# Patient Record
Sex: Female | Born: 1935 | Race: White | Hispanic: No | Marital: Married | Smoking: Never smoker
Health system: Southern US, Community
[De-identification: ages and names within clinical notes are randomized; demographics above are authoritative.]

## PROBLEM LIST (undated history)

## (undated) DIAGNOSIS — C801 Malignant (primary) neoplasm, unspecified: Secondary | ICD-10-CM

## (undated) DIAGNOSIS — I10 Essential (primary) hypertension: Secondary | ICD-10-CM

## (undated) DIAGNOSIS — R52 Pain, unspecified: Secondary | ICD-10-CM

## (undated) DIAGNOSIS — F419 Anxiety disorder, unspecified: Secondary | ICD-10-CM

## (undated) DIAGNOSIS — M1990 Unspecified osteoarthritis, unspecified site: Secondary | ICD-10-CM

## (undated) DIAGNOSIS — M791 Myalgia, unspecified site: Secondary | ICD-10-CM

## (undated) DIAGNOSIS — N63 Unspecified lump in unspecified breast: Secondary | ICD-10-CM

## (undated) DIAGNOSIS — E785 Hyperlipidemia, unspecified: Secondary | ICD-10-CM

## (undated) HISTORY — DX: Anxiety disorder, unspecified: F41.9

## (undated) HISTORY — DX: Unspecified osteoarthritis, unspecified site: M19.90

## (undated) HISTORY — PX: TONSILLECTOMY: SUR1361

## (undated) HISTORY — PX: OOPHORECTOMY: SHX86

## (undated) HISTORY — PX: BREAST SURGERY: SHX581

## (undated) HISTORY — PX: ABDOMINAL HYSTERECTOMY: SHX81

## (undated) HISTORY — DX: Hyperlipidemia, unspecified: E78.5

## (undated) HISTORY — DX: Pain, unspecified: R52

## (undated) HISTORY — DX: Myalgia, unspecified site: M79.10

## (undated) HISTORY — DX: Essential (primary) hypertension: I10

## (undated) HISTORY — PX: CATARACT EXTRACTION: SUR2

## (undated) HISTORY — PX: BACK SURGERY: SHX140

## (undated) HISTORY — DX: Unspecified lump in unspecified breast: N63.0

## (undated) HISTORY — DX: Malignant (primary) neoplasm, unspecified: C80.1

---

## 2006-08-04 ENCOUNTER — Encounter: Admission: RE | Admit: 2006-08-04 | Discharge: 2006-08-04 | Payer: Self-pay

## 2010-07-02 ENCOUNTER — Inpatient Hospital Stay (HOSPITAL_COMMUNITY): Payer: Medicare Other

## 2010-07-02 ENCOUNTER — Observation Stay (HOSPITAL_COMMUNITY)
Admission: RE | Admit: 2010-07-02 | Discharge: 2010-07-03 | Disposition: A | Discharge status: Home / Self Care | Payer: Medicare Other | Source: Ambulatory Visit | Attending: Neurosurgery | Admitting: Neurosurgery

## 2010-07-02 DIAGNOSIS — M5126 Other intervertebral disc displacement, lumbar region: Principal | ICD-10-CM | POA: Insufficient documentation

## 2010-07-02 DIAGNOSIS — G8929 Other chronic pain: Secondary | ICD-10-CM | POA: Insufficient documentation

## 2010-07-02 LAB — ABO/RH: ABO/RH(D): A NEG

## 2010-07-02 LAB — SURGICAL PCR SCREEN: MRSA, PCR: NEGATIVE

## 2010-07-02 LAB — DIFFERENTIAL
Basophils Absolute: 0 10*3/uL (ref 0.0–0.1)
Basophils Relative: 1 % (ref 0–1)
Eosinophils Absolute: 0.2 10*3/uL (ref 0.0–0.7)
Eosinophils Relative: 3 % (ref 0–5)
Lymphocytes Relative: 34 % (ref 12–46)
Lymphs Abs: 2.2 10*3/uL (ref 0.7–4.0)
Monocytes Absolute: 0.5 10*3/uL (ref 0.1–1.0)
Monocytes Relative: 7 % (ref 3–12)
Neutro Abs: 3.6 10*3/uL (ref 1.7–7.7)
Neutrophils Relative %: 55 % (ref 43–77)

## 2010-07-02 LAB — CBC
HCT: 36.8 % (ref 36.0–46.0)
Hemoglobin: 12.5 g/dL (ref 12.0–15.0)
MCH: 34.5 pg — ABNORMAL HIGH (ref 26.0–34.0)
MCHC: 34 g/dL (ref 30.0–36.0)
MCV: 101.7 fL — ABNORMAL HIGH (ref 78.0–100.0)
Platelets: 209 10*3/uL (ref 150–400)
RBC: 3.62 MIL/uL — ABNORMAL LOW (ref 3.87–5.11)
RDW: 12.4 % (ref 11.5–15.5)
WBC: 6.5 10*3/uL (ref 4.0–10.5)

## 2010-07-02 LAB — BASIC METABOLIC PANEL
BUN: 10 mg/dL (ref 6–23)
CO2: 27 mEq/L (ref 19–32)
Calcium: 9.2 mg/dL (ref 8.4–10.5)
Chloride: 103 mEq/L (ref 96–112)
Creatinine, Ser: 0.8 mg/dL (ref 0.4–1.2)
GFR calc non Af Amer: >60 mL/min (ref >60)
Glucose, Bld: 67 mg/dL — ABNORMAL LOW (ref 70–99)
Potassium: 4.5 mEq/L (ref 3.5–5.1)
Sodium: 138 mEq/L (ref 135–145)

## 2010-07-02 LAB — TYPE AND SCREEN: ABO/RH(D): A NEG

## 2010-07-05 NOTE — Op Note (Signed)
NAME:  Sophia Stone, Sophia Stone                 ACCOUNT NO.:  [id number]  MEDICAL RECORD NO.:  [id number]           PATIENT TYPE:  I  LOCATION:  3528                         FACILITY:  [facility]  PHYSICIAN:  Filbert Craze A. Lauri Purdum, M.D.    DATE OF BIRTH:  [birth year]  DATE OF PROCEDURE:  07/02/2010 DATE OF DISCHARGE:                              OPERATIVE REPORT   PREOPERATIVE DIAGNOSIS:  Left L4-5 extraforaminal herniated nucleus pulposus with radiculopathy.  POSTOPERATIVE DIAGNOSIS:  Left L4-5 extraforaminal herniated nucleus pulposus with radiculopathy.  PROCEDURE:  Left L4-5 extraforaminal microdiskectomy.  SURGEON:  Kathaleen Maser. Jabar Krysiak, MD.  ASSISTANT:  Donalee Citrin, MD.  ANESTHESIA:  General endotracheal.  INDICATIONS:  [name] is a 77 year old female with history of chronic left lower extremity pain consistent with a left-sided L4 radiculopathy, failing all conservative management.  Her workup demonstrates evidence of a small extraforaminal disk herniation with compression of the exiting left-sided L4 nerve root.  The patient did obtain relief with a left-sided L4 selective nerve root block.  I have discussed the situation with the patient and she decided to proceed with a left-sided L4-5 extraforaminal microdiskectomy.  OPERATIVE NOTE:  The patient was brought to the operating room and placed on the operating table in supine position.  After adequate level of anesthesia was achieved, the patient was placed prone onto Wilson frame, appropriately padded the patient's lumbar regions and prepped and draped sterilely.  A 10 blade was used to make a curvilinear skin incision overlying the L4-5 interspace where this was carried down sharply in the midline.  Subperiosteal dissection was then performed what was thought to be the transverse process and lateral facet of the L4-5 level.  X-rays taken in fact approach had been made to the L5-S1 level.  Approach was then redirected one level cephalad and  the retractor was replaced.  Extraforaminal approach was then performed by removing the lateral aspect of the superior facet at L5 and then resecting the intertransverse ligament.  Perineural fat was identified. L4 nerve was then identified.  Microscope was then brought onto the field using microdissection of the L4 nerve root underlying disk herniation.  Perineural fat was coagulated and divided.  L4 nerve root was gently mobilized cephalad.  A small amount of disk herniation was encountered.  This was then resected.  The disk space was entered and an intradiscal discectomy was then performed.  At this point, a very thorough diskectomy was achieved.  There was no injury to thecal sac or nerve root.  The wound was then irrigated with antibiotic solution. Gelfoam was placed topically for hemostasis and found to be good. Microscope and retractor system were removed.  Hemostasis in the muscle achieved with electrocautery.  The wound was closed in layers with Vicryl suture.  Steri-Strips and sterile dressings were applied.  There were no complications.  The patient tolerated the procedure well and she returned to the recovery room postoperatively.          ______________________________ Kathaleen Maser Jonalyn Sedlak, M.D.     HAP/MEDQ  D:  07/02/2010  T:  07/03/2010  Job:  [id number]  Electronically  Signed by Julio Sicks M.D. on 07/04/2010 11:23:40 PM

## 2010-11-30 ENCOUNTER — Telehealth (INDEPENDENT_AMBULATORY_CARE_PROVIDER_SITE_OTHER): Payer: Self-pay | Admitting: Surgery

## 2010-12-03 ENCOUNTER — Other Ambulatory Visit (INDEPENDENT_AMBULATORY_CARE_PROVIDER_SITE_OTHER): Payer: Self-pay | Admitting: Surgery

## 2010-12-03 ENCOUNTER — Ambulatory Visit (INDEPENDENT_AMBULATORY_CARE_PROVIDER_SITE_OTHER): Payer: Medicare Other | Admitting: Surgery

## 2010-12-03 ENCOUNTER — Encounter (INDEPENDENT_AMBULATORY_CARE_PROVIDER_SITE_OTHER): Payer: Self-pay | Admitting: Surgery

## 2010-12-03 VITALS — BP 122/72 | HR 72 | Temp 97.2°F | Ht 64.0 in | Wt 105.6 lb

## 2010-12-03 DIAGNOSIS — N63 Unspecified lump in unspecified breast: Secondary | ICD-10-CM | POA: Insufficient documentation

## 2010-12-03 DIAGNOSIS — C50919 Malignant neoplasm of unspecified site of unspecified female breast: Secondary | ICD-10-CM

## 2010-12-03 DIAGNOSIS — C50911 Malignant neoplasm of unspecified site of right female breast: Secondary | ICD-10-CM

## 2010-12-03 NOTE — Progress Notes (Signed)
Sophia Stone is a 77 y.o. female.    Chief Complaint  Patient presents with  . Other    new br ca    HPI HPI This is a 77 year old female referred for a evaluation of a right breast cancer. She felt a mass last week. She went to her primary care physician. A mammogram was ordered. This then prompted an ultrasound guided biopsy of the breast mass in the right breast. Final pathology showed an invasive cancer. She has been sent here for further evaluation. She has had no previous history of breast biopsies. She denies drainage from her nipples. She has no other complaints other than her chronic back discomfort  Past Medical History  Diagnosis Date  . Osteoporosis   . Hypertension   . Anxiety   . Myalgia   . Hyperlipidemia   . Arthritis   . Burning pain     back  . Breast lump     rt  . Cancer     breast- rt    Past Surgical History  Procedure Date  . Back surgery   . Oophorectomy     one ovary  . Abdominal hysterectomy   . Tonsillectomy   . Cataract extraction     bilateral    History reviewed. No pertinent family history.  Social History History  Substance Use Topics  . Smoking status: Never Smoker   . Smokeless tobacco: Not on file  . Alcohol Use: No    Not on File  Current Outpatient Prescriptions  Medication Sig Dispense Refill  . ALPRAZolam (XANAX) 1 MG tablet Take 1 mg by mouth at bedtime as needed.        Marland Kitchen HYDROcodone-acetaminophen (NORCO) 10-325 MG per tablet BID times 48H.      Marland Kitchen lisinopril (PRINIVIL,ZESTRIL) 10 MG tablet Daily.      . TOPROL XL 25 MG 24 hr tablet Daily.        Review of Systems Review of Systems  Constitutional: Negative.   HENT: Negative.   Eyes: Negative.   Respiratory: Negative.   Cardiovascular: Negative.   Gastrointestinal: Negative.   Genitourinary: Negative.   Musculoskeletal: Positive for back pain.  Skin: Negative.   Neurological: Negative.   Endo/Heme/Allergies: Negative.   Psychiatric/Behavioral: Negative.       Physical Exam Physical Exam  Constitutional: She is oriented to person, place, and time. She appears well-developed and well-nourished. No distress.  HENT:  Head: Normocephalic and atraumatic.  Right Ear: External ear normal.  Left Ear: External ear normal.  Nose: Nose normal.  Mouth/Throat: Oropharynx is clear and moist. No oropharyngeal exudate.  Eyes: EOM are normal. Pupils are equal, round, and reactive to light. No scleral icterus.  Neck: Normal range of motion. Neck supple. No tracheal deviation present. No thyromegaly present.  Cardiovascular: Normal rate, regular rhythm, normal heart sounds and intact distal pulses.   No murmur heard. Respiratory: Effort normal and breath sounds normal. No respiratory distress. She has no wheezes.  GI: Soft. Bowel sounds are normal. She exhibits no mass. There is no tenderness.  Musculoskeletal: Normal range of motion. She exhibits no edema and no tenderness.  Lymphadenopathy:    She has no cervical adenopathy.       Right cervical: No superficial cervical, no deep cervical and no posterior cervical adenopathy present.   She has axillary adenopathy.       Right axillary: Lateral adenopathy present.       Left axillary: No pectoral and no lateral adenopathy  present. Neurological: She is alert and oriented to person, place, and time.  Skin: Skin is warm and dry. No rash noted. No erythema.  Psychiatric: Her behavior is normal. Judgment normal.   Breasts are examined bilaterally. The patient has a 1-1/2 cm mass at the 7 to 8:00 position of the right breast just below the nipple. It is mobile and does not appear fixed area there are no other palpable masses in either breast. Nipples and areola are normal bilaterally Data reviewed: At the patient's mammogram showing her to have a 1.3 cm mass at the 8:00 position of the right breast 4 cm from the nipple. The biopsy showed invasive ductal carcinoma   Blood pressure 122/72, pulse 72, temperature  97.2 F (36.2 C), height 5\' 4"  (1.626 m), weight 105 lb 9.6 oz (47.9 kg).  Assessment/Plan This is a 77 year old female with invasive right breast cancer. At this point I had a long discussion with the patient and her husband. I discussed lumpectomy versus mastectomy. I also discussed sentinel lymph node biopsy. She will be undergoing an MRI of both breasts in several days. She would like to proceed with breast conservation. I will schedule her for a right breast lumpectomy and sentinel biopsy. I discussed the risk of surgery which include but are not limited to bleeding, infection, need for further surgery should the margins be positive, need for completion x-ray dissection, injury to surrounding structures including nerves, et Karie Soda. She understands this may change pending the MRI results. Surgery will be scheduled.  Sophia Stone A 12/03/2010, 2:17 PM

## 2010-12-07 ENCOUNTER — Other Ambulatory Visit (HOSPITAL_COMMUNITY): Payer: Medicare Other

## 2010-12-11 ENCOUNTER — Ambulatory Visit (HOSPITAL_COMMUNITY)
Admission: RE | Admit: 2010-12-11 | Discharge: 2010-12-11 | Disposition: A | Discharge status: Home / Self Care | Payer: Medicare Other | Source: Ambulatory Visit | Attending: Surgery | Admitting: Surgery

## 2010-12-11 ENCOUNTER — Encounter (HOSPITAL_COMMUNITY)
Admission: RE | Admit: 2010-12-11 | Discharge: 2010-12-11 | Disposition: A | Discharge status: Home / Self Care | Payer: Medicare Other | Source: Ambulatory Visit | Attending: Surgery | Admitting: Surgery

## 2010-12-11 ENCOUNTER — Other Ambulatory Visit (INDEPENDENT_AMBULATORY_CARE_PROVIDER_SITE_OTHER): Payer: Self-pay | Admitting: Surgery

## 2010-12-11 DIAGNOSIS — Z01812 Encounter for preprocedural laboratory examination: Secondary | ICD-10-CM | POA: Insufficient documentation

## 2010-12-11 DIAGNOSIS — C50911 Malignant neoplasm of unspecified site of right female breast: Secondary | ICD-10-CM

## 2010-12-11 DIAGNOSIS — Z01818 Encounter for other preprocedural examination: Secondary | ICD-10-CM | POA: Insufficient documentation

## 2010-12-11 DIAGNOSIS — C50919 Malignant neoplasm of unspecified site of unspecified female breast: Secondary | ICD-10-CM | POA: Insufficient documentation

## 2010-12-11 LAB — BASIC METABOLIC PANEL
CO2: 30 mEq/L (ref 19–32)
Chloride: 99 mEq/L (ref 96–112)
Glucose, Bld: 84 mg/dL (ref 70–99)
Potassium: 4.5 mEq/L (ref 3.5–5.1)
Sodium: 138 mEq/L (ref 135–145)

## 2010-12-11 LAB — CBC
Hemoglobin: 12.1 g/dL (ref 12.0–15.0)
MCH: 33.3 pg (ref 26.0–34.0)
RBC: 3.63 MIL/uL — ABNORMAL LOW (ref 3.87–5.11)
WBC: 6.9 10*3/uL (ref 4.0–10.5)

## 2010-12-11 LAB — SURGICAL PCR SCREEN: Staphylococcus aureus: NEGATIVE

## 2010-12-14 ENCOUNTER — Ambulatory Visit (HOSPITAL_COMMUNITY)
Admission: RE | Admit: 2010-12-14 | Discharge: 2010-12-14 | Disposition: A | Discharge status: Home / Self Care | Payer: Medicare Other | Source: Ambulatory Visit | Attending: Surgery | Admitting: Surgery

## 2010-12-14 ENCOUNTER — Other Ambulatory Visit (INDEPENDENT_AMBULATORY_CARE_PROVIDER_SITE_OTHER): Payer: Self-pay | Admitting: Surgery

## 2010-12-14 DIAGNOSIS — Z01812 Encounter for preprocedural laboratory examination: Secondary | ICD-10-CM | POA: Insufficient documentation

## 2010-12-14 DIAGNOSIS — C50911 Malignant neoplasm of unspecified site of right female breast: Secondary | ICD-10-CM

## 2010-12-14 DIAGNOSIS — Z01818 Encounter for other preprocedural examination: Secondary | ICD-10-CM | POA: Insufficient documentation

## 2010-12-14 DIAGNOSIS — C50519 Malignant neoplasm of lower-outer quadrant of unspecified female breast: Secondary | ICD-10-CM | POA: Insufficient documentation

## 2010-12-14 DIAGNOSIS — I10 Essential (primary) hypertension: Secondary | ICD-10-CM | POA: Insufficient documentation

## 2010-12-14 DIAGNOSIS — C50919 Malignant neoplasm of unspecified site of unspecified female breast: Secondary | ICD-10-CM

## 2010-12-14 MED ORDER — TECHNETIUM TC 99M SULFUR COLLOID FILTERED
1.0000 | Freq: Once | INTRAVENOUS | Status: AC | PRN
  Administered 2010-12-14: 1 via INTRADERMAL

## 2010-12-16 NOTE — Op Note (Signed)
NAMECHARLESTON, [name] NO.:  [id number]  MEDICAL RECORD NO.:  [id number]  LOCATION:  [facility]SC                         FACILITY:  [facility]  PHYSICIAN:  Abigail Miyamoto, M.D. DATE OF BIRTH:  [birth year]  DATE OF PROCEDURE:  12/14/2010 DATE OF DISCHARGE:  12/14/2010                              OPERATIVE REPORT   PREOPERATIVE DIAGNOSIS:  Right breast cancer.  POSTOPERATIVE DIAGNOSIS:  Right breast cancer.  PROCEDURE:  Right breast lumpectomy with right sentinel lymph node biopsy and injection of blue dye.  SURGEON:  Abigail Miyamoto, MD  ANESTHESIA:  General and 0.5% Marcaine.  ESTIMATED BLOOD LOSS:  Minimal.  INDICATIONS:  This is a 77 year old female with an invasive right breast cancer.  Decision has been made to proceed with lumpectomy and sentinel node biopsy.  FINDINGS:  The patient was found to have 2 lymph nodes in the axilla identified with both blue dye and radioisotope.  PROCEDURE IN DETAIL:  The patient was brought to the operating room and identified as Sophia Stone.  She was placed supine on the operative table and general anesthesia was induced.  The blue dye was then injected underneath the right nipple areola and the breast massaged.  The patient's breast and axilla on the right side were then prepped and draped in the usual sterile fashion.  The patient had a palpable mass in the 8 o'clock position of the breast.  I performed an elliptical incision over the top of the mass and took this down to the breast tissue with electrocautery.  I then performed a wide lumpectomy of all the tissue in the lower outer quadrant going all the way down the chest wall and was able to remove the mass in its entirety.  I then used paint to mark all the margins of the biopsy specimen.  The specimen was then sent to pathology for evaluation.  I then brought a NeoProbe onto the field.  I identified an area of increased uptake in the right axilla.  I then made a  small incision in the axilla.  I took this down through the axillary tissue with electrocautery.  I was then able to identify 2 lymph nodes with the NeoProbe, the first lymph node had uptake of blue dye, the second one had direct uptake of one of the radioisotope.  Both nodes were removed and sent to pathology for evaluation.  Once these were removed, there was no further uptake identified in the axilla and there were no further palpable nodes.  Both wounds were then irrigated with saline and anesthetized with Marcaine.  Again, hemostasis appeared to be achieved.  I then closed both wounds with interrupted 3-0 Vicryl sutures subcutaneously and a running 4-0 Monocryl.  Steri-Strips, gauze, and tape were then applied.  The patient tolerated the procedure well.  All counts were correct at the end of the procedure.  The patient was then extubated in the operating room and taken in stable condition to the recovery room.     Abigail Miyamoto, M.D.     DB/MEDQ  D:  12/14/2010  T:  12/14/2010  Job:  [id number]  Electronically Signed by Abigail Miyamoto M.D. on 12/16/2010 05:40:49  PM

## 2010-12-21 ENCOUNTER — Encounter (INDEPENDENT_AMBULATORY_CARE_PROVIDER_SITE_OTHER): Payer: Self-pay | Admitting: Surgery

## 2010-12-25 ENCOUNTER — Encounter (INDEPENDENT_AMBULATORY_CARE_PROVIDER_SITE_OTHER): Payer: Self-pay | Admitting: Surgery

## 2010-12-25 ENCOUNTER — Ambulatory Visit (INDEPENDENT_AMBULATORY_CARE_PROVIDER_SITE_OTHER): Payer: Medicare Other | Admitting: Surgery

## 2010-12-25 VITALS — BP 124/74 | HR 68

## 2010-12-25 DIAGNOSIS — Z09 Encounter for follow-up examination after completed treatment for conditions other than malignant neoplasm: Secondary | ICD-10-CM

## 2010-12-25 NOTE — Progress Notes (Signed)
Addended by: Wilder Glade on: 12/25/2010 12:12 PM   Modules accepted: Orders

## 2010-12-25 NOTE — Progress Notes (Signed)
Subjective:     Patient ID: Sophia Stone, female   DOB: [birth year], 77 y.o.   MRN: [id number]  HPI She is here for her first postoperative visit status post right breast lumpectomy and sentinel node biopsy. She has no complaints other than her chronic back pain Review of Systems     Objective:   Physical Exam On examination, her incisions are healing well. There is no evidence of infection.  The final pathology showed a 1.5 cm invasive right breast cancer ductal origin. Sentinel lymph nodes were negative.    Assessment:       Patient status post right breast lumpectomy and sentinel nerve biopsy for invasive breast cancer Plan:        I will now refer her to the medical and radiation oncologist for further recommendations

## 2010-12-26 ENCOUNTER — Telehealth (INDEPENDENT_AMBULATORY_CARE_PROVIDER_SITE_OTHER): Payer: Self-pay | Admitting: General Surgery

## 2010-12-26 NOTE — Telephone Encounter (Signed)
Sophia Stone called from the [facility] and stated that [name] wants to go to [place] for her Cancer Care

## 2011-01-29 ENCOUNTER — Encounter (INDEPENDENT_AMBULATORY_CARE_PROVIDER_SITE_OTHER): Payer: Medicare Other | Admitting: Surgery

## 2011-01-30 ENCOUNTER — Encounter (INDEPENDENT_AMBULATORY_CARE_PROVIDER_SITE_OTHER): Payer: Self-pay | Admitting: Surgery

## 2011-02-11 ENCOUNTER — Encounter (INDEPENDENT_AMBULATORY_CARE_PROVIDER_SITE_OTHER): Payer: Self-pay | Admitting: Surgery

## 2011-02-11 ENCOUNTER — Ambulatory Visit (INDEPENDENT_AMBULATORY_CARE_PROVIDER_SITE_OTHER): Payer: Medicare Other | Admitting: Surgery

## 2011-02-11 VITALS — BP 118/66 | HR 66 | Temp 97.9°F | Resp 16 | Ht 64.0 in | Wt 105.4 lb

## 2011-02-11 DIAGNOSIS — Z09 Encounter for follow-up examination after completed treatment for conditions other than malignant neoplasm: Secondary | ICD-10-CM

## 2011-02-11 NOTE — Progress Notes (Signed)
Subjective:     Patient ID: Sophia Stone, female   DOB: 1931/08/11, 78 y.o.   MRN: [id number]  HPI She is here for another followup. She is currently undergoing radiation therapy to the right breast. Again she is status post lumpectomy and sentinel node biopsy on the right side for invasive cancer. She has no complaints today Review of Systems     Objective:   Physical Exam On examination, the right breast incision is healing well. There are no skin changes on the breast from the radiation. Her breast and axilla are nontender. There is no right axillary adenopathy    Assessment:     Patient with invasive right breast cancer status post lumpectomy and sentinel node biopsy now undergoing radiation therapy and anti-hormonal therapy    Plan:     She will continue her self examinations. I will see her back in 6 months

## 2011-08-08 ENCOUNTER — Ambulatory Visit (INDEPENDENT_AMBULATORY_CARE_PROVIDER_SITE_OTHER): Payer: Medicare Other | Admitting: Surgery

## 2011-08-08 ENCOUNTER — Encounter (INDEPENDENT_AMBULATORY_CARE_PROVIDER_SITE_OTHER): Payer: Self-pay | Admitting: Surgery

## 2011-08-08 VITALS — BP 132/70 | HR 60 | Temp 96.4°F | Ht 64.0 in | Wt 105.6 lb

## 2011-08-08 DIAGNOSIS — Z853 Personal history of malignant neoplasm of breast: Secondary | ICD-10-CM

## 2011-08-08 NOTE — Progress Notes (Signed)
Subjective:     Patient ID: Sophia Stone, female   DOB: May 25, 1931, 78 y.o.   MRN: [id number]  HPI She is here for a 6 month followup status post right breast lumpectomy and similar biopsy for invasive right breast cancer. She is currently on arimidex. She has no complaints. Her most recent right breast mammogram was negative  Review of Systems     Objective:   Physical Exam There is no axillary adenopathy on the right side. Her incisions are well-healed. There is no right breast mass    Assessment:     Long-term followup invasive right breast cancer, doing well    Plan:     I will see her back in one year

## 2011-08-12 ENCOUNTER — Encounter (INDEPENDENT_AMBULATORY_CARE_PROVIDER_SITE_OTHER): Payer: Medicare Other | Admitting: Surgery

## 2012-08-21 ENCOUNTER — Ambulatory Visit (INDEPENDENT_AMBULATORY_CARE_PROVIDER_SITE_OTHER): Payer: Medicare Other | Admitting: Surgery

## 2012-09-01 ENCOUNTER — Ambulatory Visit (INDEPENDENT_AMBULATORY_CARE_PROVIDER_SITE_OTHER): Payer: Medicare Other | Admitting: Surgery

## 2012-09-01 ENCOUNTER — Encounter (INDEPENDENT_AMBULATORY_CARE_PROVIDER_SITE_OTHER): Payer: Self-pay | Admitting: Surgery

## 2012-09-01 VITALS — BP 132/74 | HR 72 | Temp 97.2°F | Resp 16 | Ht 64.0 in | Wt 116.0 lb

## 2012-09-01 DIAGNOSIS — Z853 Personal history of malignant neoplasm of breast: Secondary | ICD-10-CM

## 2012-09-01 NOTE — Progress Notes (Signed)
Subjective:     Patient ID: Sophia Stone, female   DOB: [birth year], 79 y.o.   MRN: [id number]  HPI She is here for his long-term followup of her right breast cancer. She was status post lumpectomy and sentinel lymph node biopsy in August of 2012 for invasive stage I lobular carcinoma. She continues on anti-hormonal therapy. She has no complaints  Review of Systems     Objective:   Physical Exam On exam, there are no palpable breast masses. There is a tiny palpable node in her right axilla that does not feel pathologic    Assessment:     Long-term followup right breast cancer     Plan:     I will see her back in one year. She will continue to be followed at [facility]

## 2013-08-25 ENCOUNTER — Encounter (INDEPENDENT_AMBULATORY_CARE_PROVIDER_SITE_OTHER): Payer: Self-pay | Admitting: Surgery

## 2013-10-04 ENCOUNTER — Encounter (INDEPENDENT_AMBULATORY_CARE_PROVIDER_SITE_OTHER): Payer: Self-pay | Admitting: Surgery

## 2013-10-04 ENCOUNTER — Ambulatory Visit (INDEPENDENT_AMBULATORY_CARE_PROVIDER_SITE_OTHER): Payer: Medicare PPO | Admitting: Surgery

## 2013-10-04 ENCOUNTER — Ambulatory Visit (INDEPENDENT_AMBULATORY_CARE_PROVIDER_SITE_OTHER): Payer: Medicare Other | Admitting: Surgery

## 2013-10-04 VITALS — BP 180/80 | HR 70 | Temp 97.9°F | Resp 12 | Ht 64.0 in | Wt 111.2 lb

## 2013-10-04 DIAGNOSIS — Z853 Personal history of malignant neoplasm of breast: Secondary | ICD-10-CM

## 2013-10-04 NOTE — Progress Notes (Signed)
Subjective:     Patient ID: Sophia Stone, female   DOB: 02/02/1932, 80 y.o.   MRN: [id number]  HPI She is here for another long-term followup of her lobular carcinoma of the left breast.  She will be 3 years out from surgery in August. She remains on tamoxifen. She has no complaints  Review of Systems     Objective:   Physical Exam On exam, there are no palpable breast masses and no axillary adenopathy.    Assessment:     Patient stable with a history of right breast cancer     Plan:     She'll continue the anti-hormonal therapy and I will see her back in one year

## 2015-06-06 DIAGNOSIS — C50911 Malignant neoplasm of unspecified site of right female breast: Secondary | ICD-10-CM | POA: Diagnosis not present

## 2015-07-02 ENCOUNTER — Emergency Department (HOSPITAL_COMMUNITY): Payer: Medicare HMO

## 2015-07-02 ENCOUNTER — Emergency Department (HOSPITAL_COMMUNITY)
Admission: EM | Admit: 2015-07-02 | Discharge: 2015-07-02 | Disposition: A | Discharge status: Home / Self Care | Payer: Medicare HMO | Attending: Emergency Medicine | Admitting: Emergency Medicine

## 2015-07-02 ENCOUNTER — Encounter (HOSPITAL_COMMUNITY): Payer: Self-pay

## 2015-07-02 DIAGNOSIS — R7989 Other specified abnormal findings of blood chemistry: Secondary | ICD-10-CM | POA: Diagnosis not present

## 2015-07-02 DIAGNOSIS — Z7952 Long term (current) use of systemic steroids: Secondary | ICD-10-CM | POA: Diagnosis not present

## 2015-07-02 DIAGNOSIS — Z23 Encounter for immunization: Secondary | ICD-10-CM | POA: Diagnosis not present

## 2015-07-02 DIAGNOSIS — F419 Anxiety disorder, unspecified: Secondary | ICD-10-CM | POA: Insufficient documentation

## 2015-07-02 DIAGNOSIS — Y998 Other external cause status: Secondary | ICD-10-CM | POA: Insufficient documentation

## 2015-07-02 DIAGNOSIS — W01198A Fall on same level from slipping, tripping and stumbling with subsequent striking against other object, initial encounter: Secondary | ICD-10-CM | POA: Diagnosis not present

## 2015-07-02 DIAGNOSIS — M1990 Unspecified osteoarthritis, unspecified site: Secondary | ICD-10-CM | POA: Diagnosis not present

## 2015-07-02 DIAGNOSIS — R531 Weakness: Secondary | ICD-10-CM | POA: Insufficient documentation

## 2015-07-02 DIAGNOSIS — Z8742 Personal history of other diseases of the female genital tract: Secondary | ICD-10-CM | POA: Insufficient documentation

## 2015-07-02 DIAGNOSIS — I10 Essential (primary) hypertension: Secondary | ICD-10-CM | POA: Diagnosis not present

## 2015-07-02 DIAGNOSIS — Y9389 Activity, other specified: Secondary | ICD-10-CM | POA: Diagnosis not present

## 2015-07-02 DIAGNOSIS — M810 Age-related osteoporosis without current pathological fracture: Secondary | ICD-10-CM | POA: Insufficient documentation

## 2015-07-02 DIAGNOSIS — S01311A Laceration without foreign body of right ear, initial encounter: Secondary | ICD-10-CM | POA: Insufficient documentation

## 2015-07-02 DIAGNOSIS — Z79899 Other long term (current) drug therapy: Secondary | ICD-10-CM | POA: Insufficient documentation

## 2015-07-02 DIAGNOSIS — E785 Hyperlipidemia, unspecified: Secondary | ICD-10-CM | POA: Diagnosis not present

## 2015-07-02 DIAGNOSIS — Y9289 Other specified places as the place of occurrence of the external cause: Secondary | ICD-10-CM | POA: Insufficient documentation

## 2015-07-02 DIAGNOSIS — W19XXXA Unspecified fall, initial encounter: Secondary | ICD-10-CM

## 2015-07-02 DIAGNOSIS — S0991XA Unspecified injury of ear, initial encounter: Secondary | ICD-10-CM | POA: Diagnosis present

## 2015-07-02 DIAGNOSIS — Z853 Personal history of malignant neoplasm of breast: Secondary | ICD-10-CM | POA: Diagnosis not present

## 2015-07-02 DIAGNOSIS — S0990XA Unspecified injury of head, initial encounter: Secondary | ICD-10-CM | POA: Diagnosis not present

## 2015-07-02 LAB — CBC WITH DIFFERENTIAL/PLATELET
BASOS PCT: 0 %
Basophils Absolute: 0 10*3/uL (ref 0.0–0.1)
Eosinophils Absolute: 0.3 10*3/uL (ref 0.0–0.7)
Eosinophils Relative: 4 %
HEMATOCRIT: 36.8 % (ref 36.0–46.0)
HEMOGLOBIN: 11.8 g/dL — AB (ref 12.0–15.0)
LYMPHS ABS: 1.9 10*3/uL (ref 0.7–4.0)
Lymphocytes Relative: 27 %
MCH: 32.5 pg (ref 26.0–34.0)
MCHC: 32.1 g/dL (ref 30.0–36.0)
MCV: 101.4 fL — AB (ref 78.0–100.0)
MONO ABS: 0.6 10*3/uL (ref 0.1–1.0)
MONOS PCT: 9 %
NEUTROS ABS: 4.2 10*3/uL (ref 1.7–7.7)
NEUTROS PCT: 60 %
Platelets: 154 10*3/uL (ref 150–400)
RBC: 3.63 MIL/uL — ABNORMAL LOW (ref 3.87–5.11)
RDW: 12.6 % (ref 11.5–15.5)
WBC: 6.9 10*3/uL (ref 4.0–10.5)

## 2015-07-02 LAB — URINE MICROSCOPIC-ADD ON: Bacteria, UA: NONE SEEN

## 2015-07-02 LAB — URINALYSIS, ROUTINE W REFLEX MICROSCOPIC
Bilirubin Urine: NEGATIVE
GLUCOSE, UA: NEGATIVE mg/dL
KETONES UR: NEGATIVE mg/dL
Nitrite: NEGATIVE
PH: 6 (ref 5.0–8.0)
Protein, ur: NEGATIVE mg/dL
Specific Gravity, Urine: 1.009 (ref 1.005–1.030)

## 2015-07-02 LAB — COMPREHENSIVE METABOLIC PANEL
ALBUMIN: 3.6 g/dL (ref 3.5–5.0)
ALK PHOS: 53 U/L (ref 38–126)
ALT: 19 U/L (ref 14–54)
ANION GAP: 10 (ref 5–15)
AST: 46 U/L — ABNORMAL HIGH (ref 15–41)
BILIRUBIN TOTAL: 0.1 mg/dL — AB (ref 0.3–1.2)
BUN: 13 mg/dL (ref 6–20)
CALCIUM: 9.5 mg/dL (ref 8.9–10.3)
CO2: 29 mmol/L (ref 22–32)
CREATININE: 1.33 mg/dL — AB (ref 0.44–1.00)
Chloride: 103 mmol/L (ref 101–111)
GFR, EST AFRICAN AMERICAN: 42 mL/min — AB (ref >60)
GFR, EST NON AFRICAN AMERICAN: 36 mL/min — AB (ref >60)
GLUCOSE: 109 mg/dL — AB (ref 65–99)
POTASSIUM: 3.9 mmol/L (ref 3.5–5.1)
Sodium: 142 mmol/L (ref 135–145)
Total Protein: 6.3 g/dL — ABNORMAL LOW (ref 6.5–8.1)

## 2015-07-02 MED ORDER — CEPHALEXIN 500 MG PO CAPS: 500.0000 mg | ORAL_CAPSULE | Freq: Four times a day (QID) | ORAL | Status: AC

## 2015-07-02 MED ORDER — TETANUS-DIPHTH-ACELL PERTUSSIS 5-2.5-18.5 LF-MCG/0.5 IM SUSP
0.5000 mL | Freq: Once | INTRAMUSCULAR | Status: AC
  Administered 2015-07-02: 0.5 mL via INTRAMUSCULAR
  Filled 2015-07-02: qty 0.5

## 2015-07-02 MED ORDER — LIDOCAINE HCL (PF) 1 % IJ SOLN
5.0000 mL | Freq: Once | INTRAMUSCULAR | Status: AC
  Administered 2015-07-02: 5 mL via INTRADERMAL
  Filled 2015-07-02: qty 5

## 2015-07-02 MED ORDER — SODIUM CHLORIDE 0.9 % IV BOLUS (SEPSIS)
500.0000 mL | Freq: Once | INTRAVENOUS | Status: AC
  Administered 2015-07-02: 500 mL via INTRAVENOUS

## 2015-07-02 NOTE — ED Provider Notes (Signed)
CSN: [id number]     Arrival date & time 07/02/15  1401 History   First MD Initiated Contact with Patient 07/02/15 1620     Chief Complaint  Patient presents with  . Ear Injury     (Consider location/radiation/quality/duration/timing/severity/associated sxs/prior Treatment) The history is provided by the patient and medical records. No language interpreter was used.     Sophia Stone is a 82 y.o. female  with a hx of osteoporosis, HTN, anxiety, arthritis, breast cancer presents to the Emergency Department complaining of acute, persistent laceration to the right ear after a fall.  Pt's husband at bedside reports she fell Friday night and pt was confused at the time.  He reports "thorough" work-up at [facility] after a presumed syncopal episode.  She was dx with a UTI that night and cipro was started.  Pt had a 2nd fall last night.  She denies LOC, syncope or dizziness with that fall.  She reports she stumbled, hitting her head and ear on a marble side table around 8pm last night while trying to get out of bed.  She reports associated headache and pain at the external right ear.  Pt denies fever, chills, neck pain, chest pain, SOB, abd pain, N/V/D, weakness, dizziness.    Patient's husband and daughter at bedside.  A report that she has had multiple similar episodes in the past. She has been worked up several times but denies recent admission to the hospital. She reports she continues to see her primary care provider about this.    Review shows the patient is taking Xanax, Flexeril, gabapentin and Norco 4 times per day.  Patient confirms that she takes these all regularly 4 times per day.  Past Medical History  Diagnosis Date  . Osteoporosis   . Hypertension   . Anxiety   . Myalgia   . Hyperlipidemia   . Arthritis   . Burning pain     back  . Breast lump     rt  . Cancer [facility])     breast- rt   Past Surgical History  Procedure Laterality Date  . Back surgery    . Oophorectomy       one ovary  . Abdominal hysterectomy    . Tonsillectomy    . Cataract extraction      bilateral  . Breast surgery     Family History  Problem Relation Age of Onset  . Hypertension Mother   . Stroke Mother   . Heart disease Mother   . Heart disease Father     open heartvsurgery   Social History  Substance Use Topics  . Smoking status: Never Smoker   . Smokeless tobacco: Never Used  . Alcohol Use: No   OB History    No data available     Review of Systems  Constitutional: Negative for fever, diaphoresis, appetite change, fatigue and unexpected weight change.  HENT: Negative for mouth sores.   Eyes: Negative for visual disturbance.  Respiratory: Negative for cough, chest tightness, shortness of breath and wheezing.   Cardiovascular: Negative for chest pain.  Gastrointestinal: Negative for nausea, vomiting, abdominal pain, diarrhea and constipation.  Endocrine: Negative for polydipsia, polyphagia and polyuria.  Genitourinary: Negative for dysuria, urgency, frequency and hematuria.  Musculoskeletal: Negative for back pain and neck stiffness.  Skin: Positive for color change and wound. Negative for rash.  Allergic/Immunologic: Negative for immunocompromised state.  Neurological: Positive for weakness and headaches. Negative for syncope and light-headedness.  Hematological: Does  not bruise/bleed easily.  Psychiatric/Behavioral: Negative for sleep disturbance. The patient is not nervous/anxious.       Allergies  Actonel  Home Medications   Prior to Admission medications   Medication Sig Start Date End Date Taking? Authorizing Provider  ALPRAZolam Duanne Moron) 1 MG tablet Take 0.5-1 mg by mouth 4 (four) times daily. Take 1/2 tablet (0.5 mg) by mouth 3 times daily, take 1 tablet (1 mg) daily at bedtime   Yes Historical Provider, MD  Cholecalciferol (VITAMIN D) 2000 units tablet Take 4,000 Units by mouth daily.   Yes Historical Provider, MD  ciprofloxacin (CIPRO) 500 MG tablet  Take 500 mg by mouth 2 (two) times daily. 10 day course filled 07/01/15 07/01/15  Yes Historical Provider, MD  cyclobenzaprine (FLEXERIL) 10 MG tablet Take 10 mg by mouth at bedtime as needed for muscle spasms.  07/30/12  Yes Historical Provider, MD  diphenhydrAMINE (BENADRYL) 25 MG tablet Take 25 mg by mouth at bedtime.   Yes Historical Provider, MD  gabapentin (NEURONTIN) 600 MG tablet Take 900 mg by mouth 4 (four) times daily.  05/17/11  Yes Historical Provider, MD  HYDROcodone-acetaminophen (NORCO) 10-325 MG per tablet Take 1 tablet by mouth every 4 (four) hours as needed (pain).  09/28/10  Yes Historical Provider, MD  Ibuprofen-Diphenhydramine Cit (ADVIL PM PO) Take 1 tablet by mouth at bedtime.   Yes Historical Provider, MD  levothyroxine (SYNTHROID, LEVOTHROID) 25 MCG tablet Take 25 mcg by mouth daily before breakfast.   Yes Historical Provider, MD  OVER THE COUNTER MEDICATION Place 1 spray into both nostrils at bedtime. Over the counter nasal spray   Yes Historical Provider, MD  Polyethyl Glycol-Propyl Glycol (SYSTANE ULTRA OP) Place 1 drop into both eyes daily.   Yes Historical Provider, MD  predniSONE (DELTASONE) 5 MG tablet Take 5 mg by mouth daily with breakfast.  07/08/11  Yes Historical Provider, MD  tamoxifen (NOLVADEX) 20 MG tablet Take 20 mg by mouth daily.  05/13/11  Yes Historical Provider, MD  venlafaxine (EFFEXOR) 25 MG tablet Take 50 mg by mouth at bedtime. For hot flashes   Yes Historical Provider, MD  cephALEXin (KEFLEX) 500 MG capsule Take 1 capsule (500 mg total) by mouth 4 (four) times daily. 07/02/15   Kaylan Yates, PA-C   BP 117/74 mmHg  Pulse 85  Temp(Src) 98 F (36.7 C) (Oral)  Resp 16  Ht 5\' 4"  (1.626 m)  Wt 52.164 kg  BMI 19.73 kg/m2  SpO2 100% Physical Exam  Constitutional: She is oriented to person, place, and time. She appears well-developed and well-nourished. No distress.  HENT:  Head: Normocephalic.    Right Ear: Tympanic membrane and ear canal normal.  Right ear exhibits lacerations.  Left Ear: Tympanic membrane, external ear and ear canal normal.  Ears:  Nose: Nose normal. No epistaxis. Right sinus exhibits no maxillary sinus tenderness and no frontal sinus tenderness. Left sinus exhibits no maxillary sinus tenderness and no frontal sinus tenderness.  Mouth/Throat: Uvula is midline, oropharynx is clear and moist and mucous membranes are normal. Mucous membranes are not pale and not cyanotic. No oropharyngeal exudate, posterior oropharyngeal edema, posterior oropharyngeal erythema or tonsillar abscesses.  Through and through 4cm laceration to the tragus of the right ear Large area of contusion and ecchymosis to the right temporal region  Eyes: Conjunctivae and EOM are normal. Pupils are equal, round, and reactive to light. No scleral icterus.  No horizontal, vertical or rotational nystagmus  Neck: Normal range of motion and full  passive range of motion without pain. Neck supple.  Full active and passive ROM without pain No midline or paraspinal tenderness No nuchal rigidity or meningeal signs  Cardiovascular: Normal rate, regular rhythm, normal heart sounds and intact distal pulses.   Pulmonary/Chest: Effort normal and breath sounds normal. No stridor. No respiratory distress. She has no wheezes. She has no rales.  Clear and equal breath sounds without focal wheezes, rhonchi, rales  Abdominal: Soft. Bowel sounds are normal. There is no tenderness. There is no rebound and no guarding.  Musculoskeletal: Normal range of motion.  Lymphadenopathy:    She has no cervical adenopathy.  Neurological: She is alert and oriented to person, place, and time. She has normal reflexes. No cranial nerve deficit. She exhibits normal muscle tone. Coordination normal.  Mental Status:  Alert, oriented, thought content appropriate. Speech fluent without evidence of aphasia. Able to follow 2 step commands without difficulty.  Cranial Nerves:  II:  Peripheral  visual fields grossly normal, pupils equal, round, reactive to light III,IV, VI: ptosis not present, extra-ocular motions intact bilaterally  V,VII: smile symmetric, facial light touch sensation equal VIII: hearing grossly normal bilaterally  IX,X: midline uvula rise  XI: bilateral shoulder shrug equal and strong XII: midline tongue extension  Motor:  5/5 in upper and lower extremities bilaterally including strong and equal grip strength and dorsiflexion/plantar flexion Sensory: Pinprick and light touch normal in all extremities.  Deep Tendon Reflexes: 2+ and symmetric  Cerebellar: normal finger-to-nose with bilateral upper extremities Gait: normal gait and balance CV: distal pulses palpable throughout   Skin: Skin is warm and dry. No rash noted. She is not diaphoretic.  Psychiatric: She has a normal mood and affect. Her behavior is normal. Judgment and thought content normal.  Nursing note and vitals reviewed.   ED Course  .Marland KitchenLaceration Repair Date/Time: 07/02/2015 7:34 PM Performed by: Abigail Butts Authorized by: Abigail Butts Consent: Verbal consent obtained. Risks and benefits: risks, benefits and alternatives were discussed Consent given by: patient Patient understanding: patient states understanding of the procedure being performed Patient consent: the patient's understanding of the procedure matches consent given Procedure consent: procedure consent matches procedure scheduled Relevant documents: relevant documents present and verified Site marked: the operative site was marked Imaging studies: imaging studies available Required items: required blood products, implants, devices, and special equipment available Patient identity confirmed: verbally with patient and arm band Time out: Immediately prior to procedure a "time out" was called to verify the correct patient, procedure, equipment, support staff and site/side marked as required. Body area:  head/neck Location details: right ear Laceration length: 4 cm Foreign bodies: no foreign bodies Tendon involvement: none Nerve involvement: none Vascular damage: no Anesthesia: local infiltration Local anesthetic: lidocaine 1% without epinephrine Anesthetic total: 2.5 ml Patient sedated: no Preparation: Patient was prepped and draped in the usual sterile fashion. Irrigation solution: saline Irrigation method: syringe Amount of cleaning: extensive Debridement: none Degree of undermining: none Skin closure: 5-0 Prolene Number of sutures: 5 Technique: simple Approximation: close Approximation difficulty: complex Dressing: 4x4 sterile gauze Patient tolerance: Patient tolerated the procedure well with no immediate complications   (including critical care time) Labs Review Labs Reviewed  CBC WITH DIFFERENTIAL/PLATELET - Abnormal; Notable for the following:    RBC 3.63 (*)    Hemoglobin 11.8 (*)    MCV 101.4 (*)    All other components within normal limits  COMPREHENSIVE METABOLIC PANEL - Abnormal; Notable for the following:    Glucose, Bld 109 (*)  Creatinine, Ser 1.33 (*)    Total Protein 6.3 (*)    AST 46 (*)    Total Bilirubin 0.1 (*)    GFR calc non Af Amer 36 (*)    GFR calc Af Amer 42 (*)    All other components within normal limits  URINALYSIS, ROUTINE W REFLEX MICROSCOPIC (NOT AT [facility]) - Abnormal; Notable for the following:    Hgb urine dipstick TRACE (*)    Leukocytes, UA SMALL (*)    All other components within normal limits  URINE MICROSCOPIC-ADD ON - Abnormal; Notable for the following:    Squamous Epithelial / LPF 0-5 (*)    All other components within normal limits    Imaging Review Dg Chest 2 View  07/02/2015  CLINICAL DATA:  Recurrent falls over past 2 days. Worsening productive cough and chest tightness for 3 days. Hypertension. Personal history of right breast carcinoma. EXAM: CHEST  2 VIEW COMPARISON:  03/31/2014 FINDINGS: The heart size and  mediastinal contours are within normal limits. Both lungs are clear. The visualized skeletal structures are unremarkable except for prior lumbar vertebroplasty. IMPRESSION: No active cardiopulmonary disease. Electronically Signed   By: Earle Gell M.D.   On: 07/02/2015 18:10   Ct Head Wo Contrast  07/02/2015  CLINICAL DATA:  Multiple falls, right forehead abrasion, laceration to right ear, neck pain EXAM: CT HEAD WITHOUT CONTRAST CT CERVICAL SPINE WITHOUT CONTRAST TECHNIQUE: Multidetector CT imaging of the head and cervical spine was performed following the standard protocol without intravenous contrast. Multiplanar CT image reconstructions of the cervical spine were also generated. COMPARISON:  [facility]val [facility] CT head/ cervical spine dated 06/30/2015 FINDINGS: CT HEAD FINDINGS No evidence of parenchymal hemorrhage or extra-axial fluid collection. 13 mm calcified meningioma overlying the left parietal lobe (series 202/image 44). No mass effect or midline shift. No CT evidence of acute infarction. Subcortical white matter and periventricular small vessel ischemic changes. Mild age related cortical atrophy.  No ventriculomegaly. The visualized paranasal sinuses are essentially clear. The mastoid air cells are unopacified. No evidence of calvarial fracture. CT CERVICAL SPINE FINDINGS Normal cervical lordosis. No evidence of fracture or dislocation. Vertebral body heights are maintained. Dens appears intact. No prevertebral soft tissue swelling. Mild degenerative changes, most prominent at C4-5 and C5-6. Visualized thyroid is unremarkable. Visualized lung apices are notable for mild pleural parenchymal scarring. IMPRESSION: No evidence of acute intracranial abnormality. Stable 13 mm left parietal meningioma. Atrophy with small vessel ischemic changes. No evidence of traumatic injury to the cervical spine. Mild degenerative changes. Electronically Signed   By: Julian Hy M.D.   On: 07/02/2015 18:37   Ct Cervical  Spine Wo Contrast  07/02/2015  CLINICAL DATA:  Multiple falls, right forehead abrasion, laceration to right ear, neck pain EXAM: CT HEAD WITHOUT CONTRAST CT CERVICAL SPINE WITHOUT CONTRAST TECHNIQUE: Multidetector CT imaging of the head and cervical spine was performed following the standard protocol without intravenous contrast. Multiplanar CT image reconstructions of the cervical spine were also generated. COMPARISON:  Oval Linsey CT head/ cervical spine dated 06/30/2015 FINDINGS: CT HEAD FINDINGS No evidence of parenchymal hemorrhage or extra-axial fluid collection. 13 mm calcified meningioma overlying the left parietal lobe (series 202/image 44). No mass effect or midline shift. No CT evidence of acute infarction. Subcortical white matter and periventricular small vessel ischemic changes. Mild age related cortical atrophy.  No ventriculomegaly. The visualized paranasal sinuses are essentially clear. The mastoid air cells are unopacified. No evidence of calvarial fracture. CT CERVICAL SPINE FINDINGS Normal cervical  lordosis. No evidence of fracture or dislocation. Vertebral body heights are maintained. Dens appears intact. No prevertebral soft tissue swelling. Mild degenerative changes, most prominent at C4-5 and C5-6. Visualized thyroid is unremarkable. Visualized lung apices are notable for mild pleural parenchymal scarring. IMPRESSION: No evidence of acute intracranial abnormality. Stable 13 mm left parietal meningioma. Atrophy with small vessel ischemic changes. No evidence of traumatic injury to the cervical spine. Mild degenerative changes. Electronically Signed   By: Julian Hy M.D.   On: 07/02/2015 18:37   I have personally reviewed and evaluated these images and lab results as part of my medical decision-making.   EKG Interpretation   Date/Time:  Sunday July 02 2015 20:15:50 EST Ventricular Rate:  90 PR Interval:  155 QRS Duration: 78 QT Interval:  351 QTC Calculation: 429 R Axis:    51 Text Interpretation:  Sinus rhythm Probable left atrial enlargement  Baseline wander in lead(s) V4 No old tracing to compare Confirmed by  Wheeler  MD, [place] ([id number]) on 07/02/2015 8:27:14 PM      MDM   Final diagnoses:  Fall, initial encounter  Laceration of right ear, initial encounter  Generalized weakness  Elevated serum creatinine   Chilton [place]ouse returns after fall.  Slight elevation in serum creatinine today. Fluid bolus given. CT scan of head and neck without acute abnormalities.  Labs are otherwise reassuring. Patient is currently taking Cipro for UTI. Urinalysis today shows small leukocytes and 6-30 white blood cells but is nitrite negative.  Request that she continue Cipro.  Concern for recurrent falls, particularly to in the last 48 hours.  Also some concern that this may be cardiac in nature.  EKG is without acute abnormality and patient is without chest pain or shortness of breath.  Both Dr. Colin Mulders and I discussed this concern with the patient. She and her husband decline admission at this time reporting that they are continuing outpatient evaluation of the symptoms. Patient has been ambulatory times without assistance here in the emergency department. She has a normal neurologic exam and ambulates without ataxia.  Right ear laceration is greater than 24 hours old.  Pressure irrigation performed. Wound explored and base of wound visualized in a bloodless field without evidence of foreign body.  Laceration occurred > 8 hours prior to repair however repair was required due to the extent of the laceration. Patient will be discharged with Keflex to help prevent infection.  Tdap updated.  Pt has no comorbidities to effect normal wound healing.  Discussed suture home care with patient and answered questions. Pt to follow-up for wound check and suture removal in 5-7 days; they are to return to the ED sooner for signs of infection.   The patient was discussed with and seen by Dr. Regenia Skeeter  who agrees with the treatment plan.   Jarrett Soho Scout Gumbs, PA-C 07/02/15 [id number]  Sherwood Gambler, MD 07/05/15 [phone]

## 2015-07-02 NOTE — Discharge Instructions (Signed)

## 2015-07-02 NOTE — ED Notes (Signed)
Pt reports trying to get out of bed last night at 8pm and fell over marble side table, cutting over the antitragus and lobe of right ear.  Pt was seen at urgent care this morning who referred pt to ED.  No bleeding noted.

## 2015-07-02 NOTE — ED Notes (Signed)
Pt ambulated in hallway with no problems.

## 2015-07-02 NOTE — ED Notes (Signed)
Patient transported to CT 

## 2015-12-08 DIAGNOSIS — C50511 Malignant neoplasm of lower-outer quadrant of right female breast: Secondary | ICD-10-CM

## 2016-01-15 DIAGNOSIS — Z79810 Long term (current) use of selective estrogen receptor modulators (SERMs): Secondary | ICD-10-CM | POA: Diagnosis not present

## 2016-01-15 DIAGNOSIS — C50511 Malignant neoplasm of lower-outer quadrant of right female breast: Secondary | ICD-10-CM

## 2016-08-28 DIAGNOSIS — G9341 Metabolic encephalopathy: Secondary | ICD-10-CM

## 2016-08-28 DIAGNOSIS — A419 Sepsis, unspecified organism: Secondary | ICD-10-CM

## 2016-08-28 DIAGNOSIS — I4891 Unspecified atrial fibrillation: Secondary | ICD-10-CM | POA: Diagnosis not present

## 2016-08-28 DIAGNOSIS — N179 Acute kidney failure, unspecified: Secondary | ICD-10-CM

## 2016-08-28 DIAGNOSIS — E039 Hypothyroidism, unspecified: Secondary | ICD-10-CM | POA: Diagnosis not present

## 2016-08-28 DIAGNOSIS — F419 Anxiety disorder, unspecified: Secondary | ICD-10-CM | POA: Diagnosis not present

## 2016-08-28 DIAGNOSIS — D649 Anemia, unspecified: Secondary | ICD-10-CM | POA: Diagnosis not present

## 2016-08-28 DIAGNOSIS — M549 Dorsalgia, unspecified: Secondary | ICD-10-CM | POA: Diagnosis not present

## 2016-08-28 DIAGNOSIS — K56609 Unspecified intestinal obstruction, unspecified as to partial versus complete obstruction: Secondary | ICD-10-CM | POA: Diagnosis not present

## 2016-08-29 DIAGNOSIS — K56609 Unspecified intestinal obstruction, unspecified as to partial versus complete obstruction: Secondary | ICD-10-CM | POA: Diagnosis not present

## 2016-08-29 DIAGNOSIS — N179 Acute kidney failure, unspecified: Secondary | ICD-10-CM | POA: Diagnosis not present

## 2016-08-29 DIAGNOSIS — A419 Sepsis, unspecified organism: Secondary | ICD-10-CM | POA: Diagnosis not present

## 2016-08-29 DIAGNOSIS — G9341 Metabolic encephalopathy: Secondary | ICD-10-CM | POA: Diagnosis not present

## 2016-08-30 DIAGNOSIS — A419 Sepsis, unspecified organism: Secondary | ICD-10-CM | POA: Diagnosis not present

## 2016-08-30 DIAGNOSIS — G9341 Metabolic encephalopathy: Secondary | ICD-10-CM | POA: Diagnosis not present

## 2016-08-30 DIAGNOSIS — D649 Anemia, unspecified: Secondary | ICD-10-CM

## 2016-08-30 DIAGNOSIS — K56609 Unspecified intestinal obstruction, unspecified as to partial versus complete obstruction: Secondary | ICD-10-CM | POA: Diagnosis not present

## 2016-08-30 DIAGNOSIS — I4891 Unspecified atrial fibrillation: Secondary | ICD-10-CM

## 2016-08-30 DIAGNOSIS — N179 Acute kidney failure, unspecified: Secondary | ICD-10-CM | POA: Diagnosis not present

## 2016-09-27 DEATH — deceased

## 2017-05-04 IMAGING — CT CT HEAD W/O CM
2 series · 16 of 30 positions shown, 18 images · non-contrast
Comparison: [place] CT head/ cervical spine dated 06/30/2015

CLINICAL DATA: Multiple falls, right forehead abrasion, laceration
to right ear, neck pain

EXAM:
CT HEAD WITHOUT CONTRAST
CT CERVICAL SPINE WITHOUT CONTRAST
TECHNIQUE: Multidetector CT imaging of the head and cervical spine was
performed following the standard protocol without intravenous
contrast. Multiplanar CT image reconstructions of the cervical spine
were also generated.

[Series 201: head w/o, idose (1) · axial · non-contrast · 0.39mm/px · z∈[+258,+363]mm · 8 of 28 slices shown, 10 images]
[im 4/28  brain]
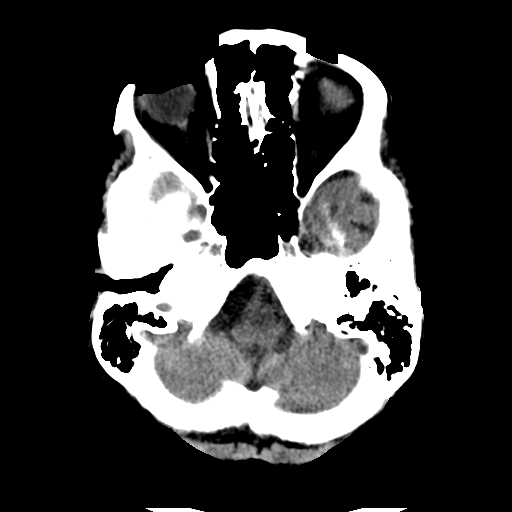
[im 4/28  bone]
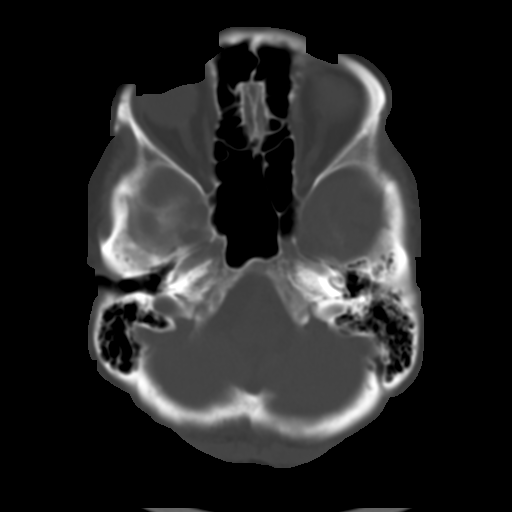
[im 7/28  brain]
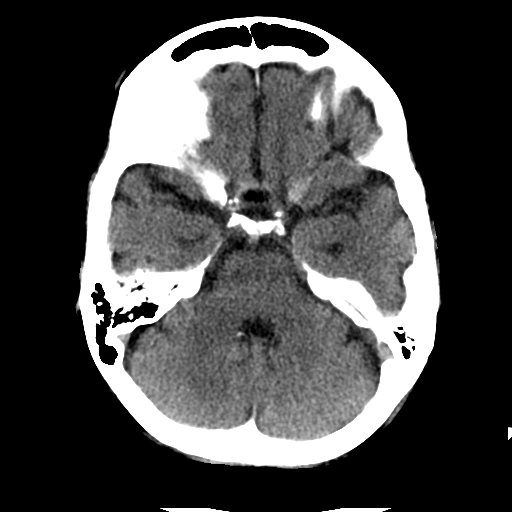
[im 10/28  brain]
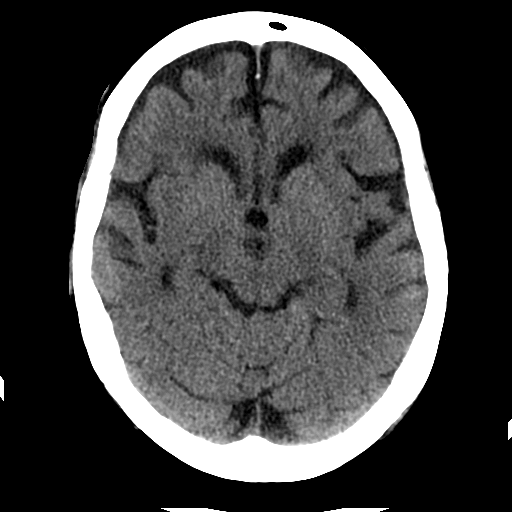
[im 13/28  brain]
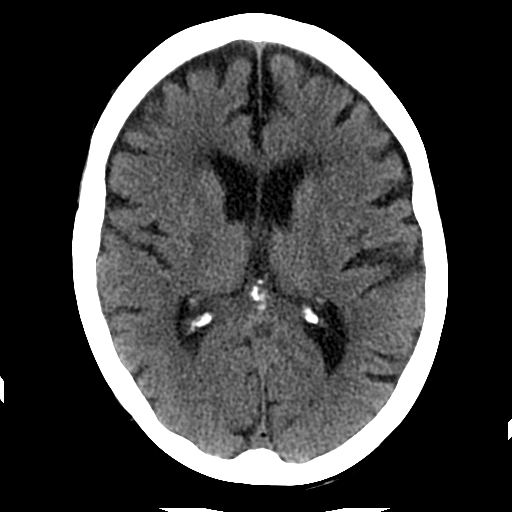
[im 16/28  brain]
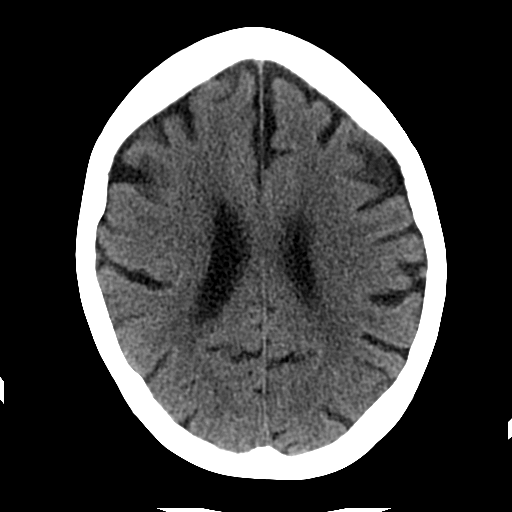
[im 16/28  bone]
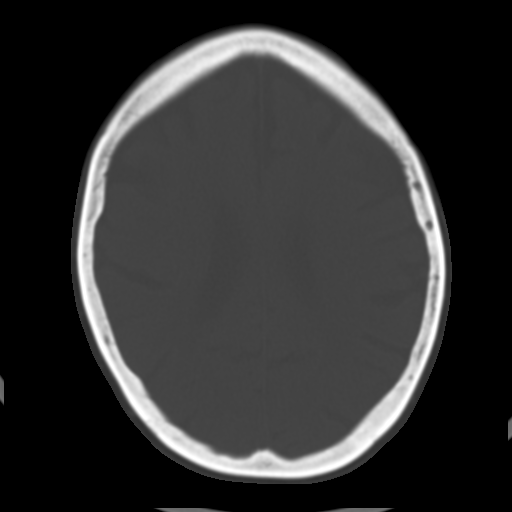
[im 19/28  brain]
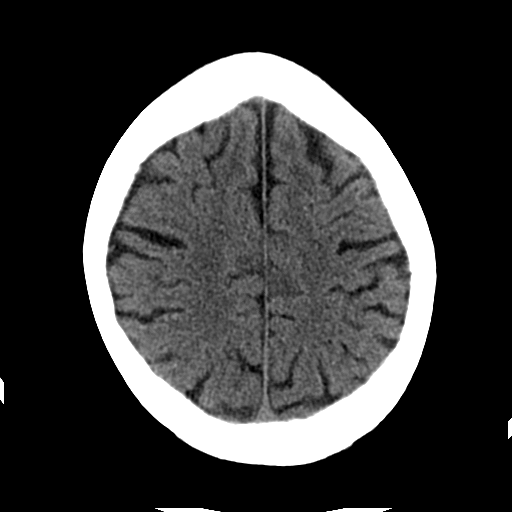
[im 22/28  brain]
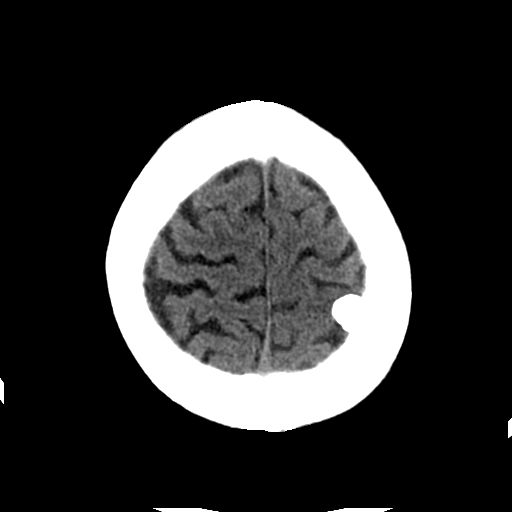
[im 25/28  brain]
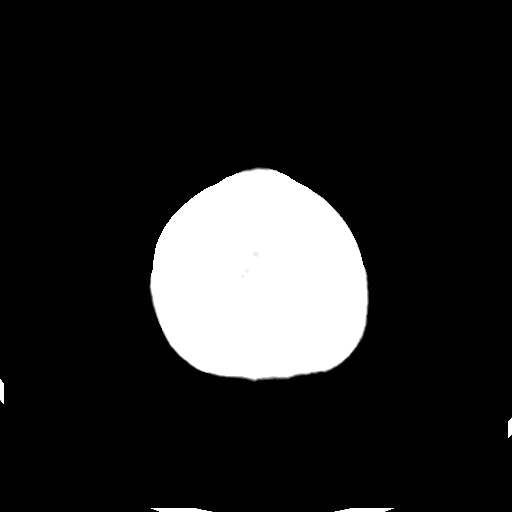

[Series 202: head w/o bone, idose (1) · axial · non-contrast · 0.39mm/px · z∈[+254,+364]mm · 8 of 56 slices shown]
[im 6/56  bone]
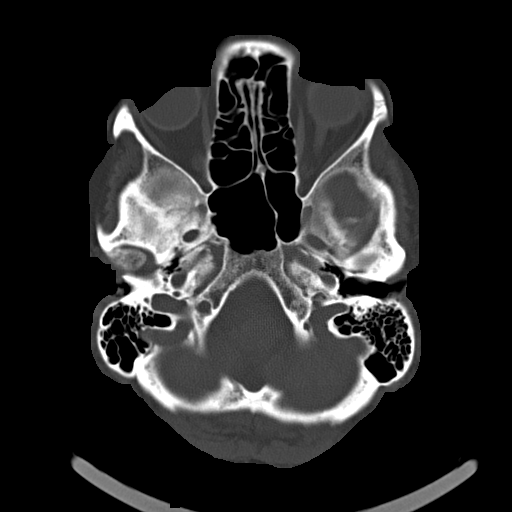
[im 12/56  bone]
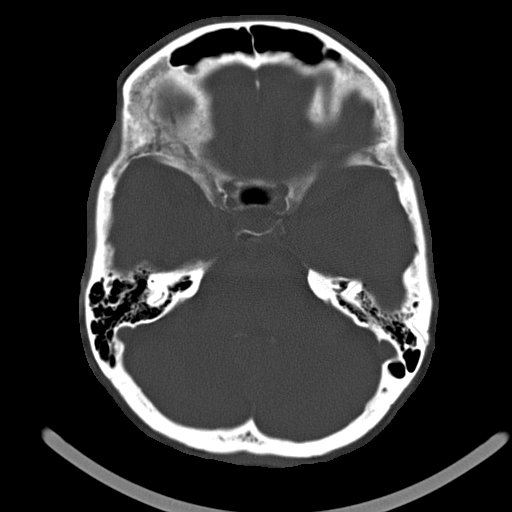
[im 18/56  bone]
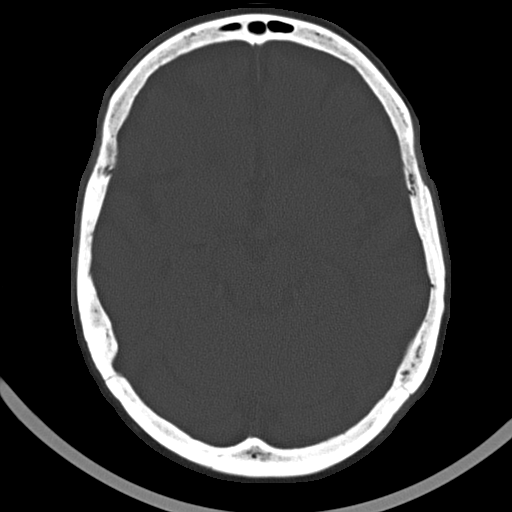
[im 24/56  bone]
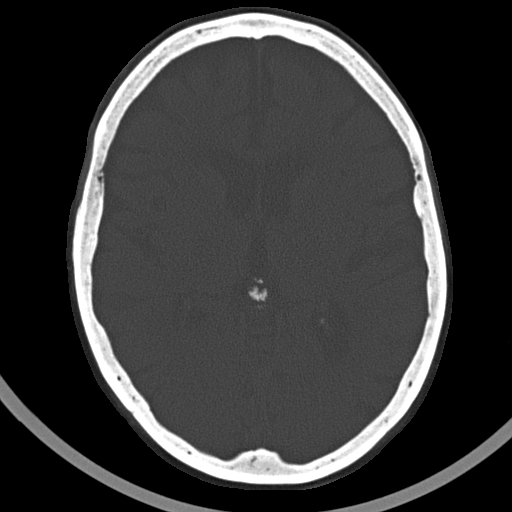
[im 32/56  bone]
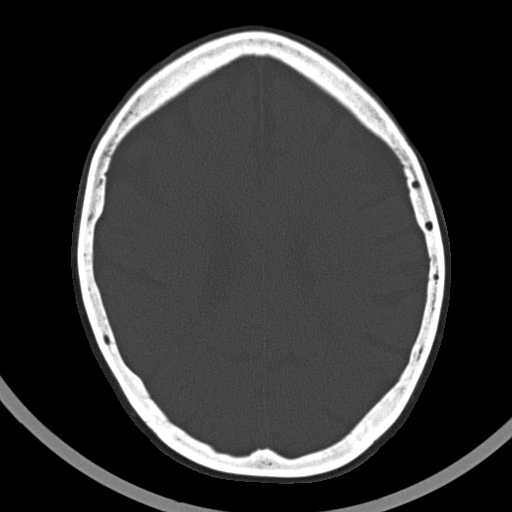
[im 38/56  bone]
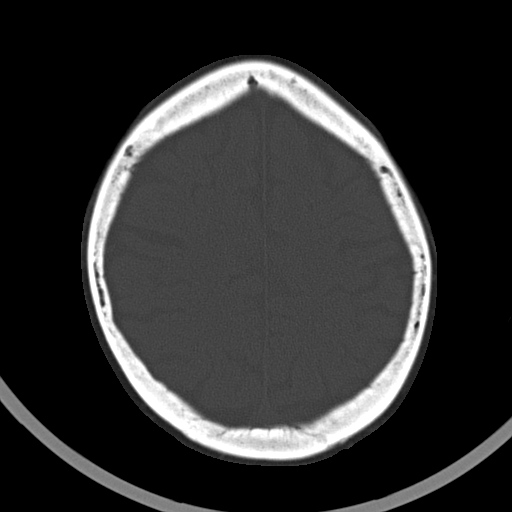
[im 44/56  bone]
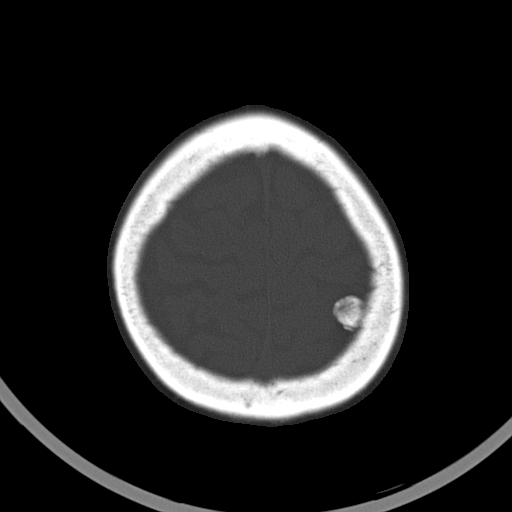
[im 50/56  bone]
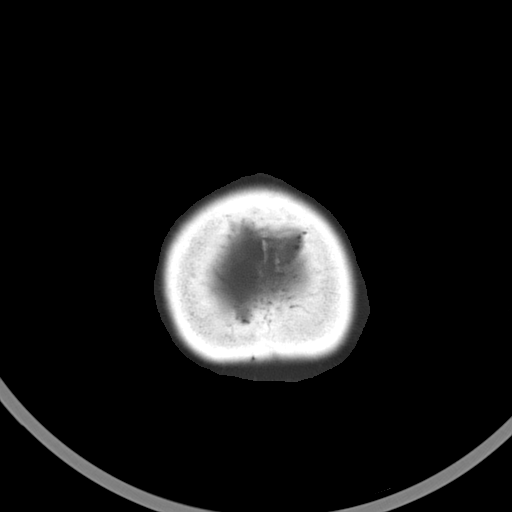

[16 of 30 positions shown; findings below may reference images not displayed]

FINDINGS: CT HEAD FINDINGS

No evidence of parenchymal hemorrhage or extra-axial fluid
collection.

13 mm calcified meningioma overlying the left parietal lobe (series
202/image 44). No mass effect or midline shift.

No CT evidence of acute infarction.

Subcortical white matter and periventricular small vessel ischemic
changes.

Mild age related cortical atrophy.  No ventriculomegaly.

The visualized paranasal sinuses are essentially clear. The mastoid
air cells are unopacified.

No evidence of calvarial fracture.

CT CERVICAL SPINE FINDINGS

Normal cervical lordosis.

No evidence of fracture or dislocation. Vertebral body heights are
maintained. Dens appears intact.

No prevertebral soft tissue swelling.

Mild degenerative changes, most prominent at C4-5 and C5-6.

Visualized thyroid is unremarkable.

Visualized lung apices are notable for mild pleural parenchymal
scarring.
IMPRESSION: No evidence of acute intracranial abnormality. Stable 13 mm left
parietal meningioma. Atrophy with small vessel ischemic changes.

No evidence of traumatic injury to the cervical spine. Mild
degenerative changes.
